# Patient Record
Sex: Male | Born: 1965 | Race: White | Hispanic: No | Marital: Married | Smoking: Never smoker
Health system: Southern US, Community
[De-identification: ages and names within clinical notes are randomized; demographics above are authoritative.]

## PROBLEM LIST (undated history)

## (undated) DIAGNOSIS — E785 Hyperlipidemia, unspecified: Secondary | ICD-10-CM

## (undated) DIAGNOSIS — I10 Essential (primary) hypertension: Secondary | ICD-10-CM

## (undated) HISTORY — PX: SPERMATOCELECTOMY: SHX2420

## (undated) HISTORY — DX: Hyperlipidemia, unspecified: E78.5

---

## 2012-02-01 ENCOUNTER — Encounter (HOSPITAL_COMMUNITY)
Admission: EM | Disposition: A | Discharge status: Home Health | Payer: Self-pay | Source: Home / Self Care | Attending: Emergency Medicine

## 2012-02-01 ENCOUNTER — Encounter (HOSPITAL_COMMUNITY): Payer: Self-pay | Admitting: Anesthesiology

## 2012-02-01 ENCOUNTER — Ambulatory Visit (HOSPITAL_COMMUNITY)
Admission: EM | Admit: 2012-02-01 | Discharge: 2012-02-02 | Disposition: A | Discharge status: Home Health | Attending: Orthopedic Surgery | Admitting: Orthopedic Surgery

## 2012-02-01 ENCOUNTER — Ambulatory Visit: Admit: 2012-02-01 | Payer: Self-pay | Admitting: Orthopedic Surgery

## 2012-02-01 ENCOUNTER — Encounter (HOSPITAL_COMMUNITY): Payer: Self-pay | Admitting: Emergency Medicine

## 2012-02-01 ENCOUNTER — Emergency Department (HOSPITAL_COMMUNITY): Admitting: Anesthesiology

## 2012-02-01 ENCOUNTER — Emergency Department (HOSPITAL_COMMUNITY)

## 2012-02-01 DIAGNOSIS — W230XXA Caught, crushed, jammed, or pinched between moving objects, initial encounter: Secondary | ICD-10-CM | POA: Insufficient documentation

## 2012-02-01 DIAGNOSIS — IMO0002 Reserved for concepts with insufficient information to code with codable children: Secondary | ICD-10-CM | POA: Insufficient documentation

## 2012-02-01 DIAGNOSIS — Z23 Encounter for immunization: Secondary | ICD-10-CM | POA: Insufficient documentation

## 2012-02-01 DIAGNOSIS — I10 Essential (primary) hypertension: Secondary | ICD-10-CM | POA: Insufficient documentation

## 2012-02-01 DIAGNOSIS — Y929 Unspecified place or not applicable: Secondary | ICD-10-CM | POA: Insufficient documentation

## 2012-02-01 DIAGNOSIS — S62609B Fracture of unspecified phalanx of unspecified finger, initial encounter for open fracture: Secondary | ICD-10-CM

## 2012-02-01 DIAGNOSIS — S61209A Unspecified open wound of unspecified finger without damage to nail, initial encounter: Secondary | ICD-10-CM | POA: Insufficient documentation

## 2012-02-01 HISTORY — DX: Essential (primary) hypertension: I10

## 2012-02-01 HISTORY — PX: ORIF FINGER FRACTURE: SHX2122

## 2012-02-01 LAB — CBC
HCT: 44 % (ref 39.0–52.0)
Hemoglobin: 15.4 g/dL (ref 13.0–17.0)
MCH: 30.3 pg (ref 26.0–34.0)
MCHC: 35 g/dL (ref 30.0–36.0)
MCV: 86.6 fL (ref 78.0–100.0)
Platelets: 277 10*3/uL (ref 150–400)
RBC: 5.08 MIL/uL (ref 4.22–5.81)
RDW: 13.4 % (ref 11.5–15.5)
WBC: 14.8 10*3/uL — ABNORMAL HIGH (ref 4.0–10.5)

## 2012-02-01 LAB — BASIC METABOLIC PANEL
BUN: 14 mg/dL (ref 6–23)
CO2: 30 mEq/L (ref 19–32)
Calcium: 9.6 mg/dL (ref 8.4–10.5)
Chloride: 100 mEq/L (ref 96–112)
Creatinine, Ser: 0.96 mg/dL (ref 0.50–1.35)
GFR calc Af Amer: >90 mL/min (ref >90)
GFR calc non Af Amer: >90 mL/min (ref >90)
Glucose, Bld: 120 mg/dL — ABNORMAL HIGH (ref 70–99)
Potassium: 3.9 mEq/L (ref 3.5–5.1)
Sodium: 137 mEq/L (ref 135–145)

## 2012-02-01 SURGERY — OPEN REDUCTION INTERNAL FIXATION (ORIF) METACARPAL (FINGER) FRACTURE
Anesthesia: General | Site: Finger | Laterality: Left | Wound class: Contaminated

## 2012-02-01 MED ORDER — EPHEDRINE SULFATE 50 MG/ML IJ SOLN
INTRAMUSCULAR | Status: DC | PRN
  Administered 2012-02-01: 10 mg via INTRAVENOUS

## 2012-02-01 MED ORDER — METHOCARBAMOL 500 MG PO TABS: 500.0000 mg | ORAL_TABLET | Freq: Four times a day (QID) | ORAL | Status: DC | PRN

## 2012-02-01 MED ORDER — HYDROCODONE-ACETAMINOPHEN 5-325 MG PO TABS
1.0000 | ORAL_TABLET | ORAL | Status: DC | PRN
  Administered 2012-02-01 – 2012-02-02 (×2): 2 via ORAL
  Filled 2012-02-01 (×2): qty 2

## 2012-02-01 MED ORDER — KCL IN DEXTROSE-NACL 20-5-0.45 MEQ/L-%-% IV SOLN
INTRAVENOUS | Status: DC
  Administered 2012-02-01: 23:00:00 via INTRAVENOUS
  Filled 2012-02-01 (×2): qty 1000

## 2012-02-01 MED ORDER — TETANUS-DIPHTH-ACELL PERTUSSIS 5-2.5-18.5 LF-MCG/0.5 IM SUSP
0.5000 mL | Freq: Once | INTRAMUSCULAR | Status: AC
  Administered 2012-02-01: 0.5 mL via INTRAMUSCULAR
  Filled 2012-02-01: qty 0.5

## 2012-02-01 MED ORDER — OXYCODONE HCL 5 MG PO TABS: 5.0000 mg | ORAL_TABLET | ORAL | Status: DC | PRN

## 2012-02-01 MED ORDER — ONDANSETRON HCL 4 MG PO TABS: 4.0000 mg | ORAL_TABLET | Freq: Four times a day (QID) | ORAL | Status: DC | PRN

## 2012-02-01 MED ORDER — ADULT MULTIVITAMIN W/MINERALS CH
1.0000 | ORAL_TABLET | Freq: Every day | ORAL | Status: DC
  Administered 2012-02-01 – 2012-02-02 (×2): 1 via ORAL
  Filled 2012-02-01 (×2): qty 1

## 2012-02-01 MED ORDER — FENTANYL CITRATE 0.05 MG/ML IJ SOLN
INTRAMUSCULAR | Status: DC | PRN
  Administered 2012-02-01 (×2): 100 ug via INTRAVENOUS
  Administered 2012-02-01 (×2): 50 ug via INTRAVENOUS

## 2012-02-01 MED ORDER — DROPERIDOL 2.5 MG/ML IJ SOLN
INTRAMUSCULAR | Status: DC | PRN
  Administered 2012-02-01: 0.625 mg via INTRAVENOUS

## 2012-02-01 MED ORDER — LACTATED RINGERS IV SOLN
INTRAVENOUS | Status: DC | PRN
  Administered 2012-02-01 (×2): via INTRAVENOUS

## 2012-02-01 MED ORDER — VITAMIN C 500 MG PO TABS
1000.0000 mg | ORAL_TABLET | Freq: Every day | ORAL | Status: DC
  Administered 2012-02-01 – 2012-02-02 (×2): 1000 mg via ORAL
  Filled 2012-02-01 (×2): qty 2

## 2012-02-01 MED ORDER — ONDANSETRON HCL 4 MG/2ML IJ SOLN
4.0000 mg | Freq: Once | INTRAMUSCULAR | Status: AC
  Administered 2012-02-01: 4 mg via INTRAVENOUS
  Filled 2012-02-01: qty 2

## 2012-02-01 MED ORDER — DOCUSATE SODIUM 100 MG PO CAPS
100.0000 mg | ORAL_CAPSULE | Freq: Two times a day (BID) | ORAL | Status: DC
  Administered 2012-02-01 – 2012-02-02 (×2): 100 mg via ORAL
  Filled 2012-02-01 (×3): qty 1

## 2012-02-01 MED ORDER — MORPHINE SULFATE 4 MG/ML IJ SOLN
6.0000 mg | Freq: Once | INTRAMUSCULAR | Status: AC
  Administered 2012-02-01: 4 mg via INTRAVENOUS
  Filled 2012-02-01: qty 1

## 2012-02-01 MED ORDER — DIPHENHYDRAMINE HCL 25 MG PO CAPS
25.0000 mg | ORAL_CAPSULE | Freq: Four times a day (QID) | ORAL | Status: DC | PRN
  Administered 2012-02-01: 25 mg via ORAL
  Filled 2012-02-01: qty 1

## 2012-02-01 MED ORDER — CEFAZOLIN SODIUM 1-5 GM-% IV SOLN
1.0000 g | Freq: Three times a day (TID) | INTRAVENOUS | Status: DC
  Administered 2012-02-02 (×2): 1 g via INTRAVENOUS
  Filled 2012-02-01 (×3): qty 50

## 2012-02-01 MED ORDER — MORPHINE SULFATE 4 MG/ML IJ SOLN
6.0000 mg | Freq: Once | INTRAMUSCULAR | Status: AC
  Administered 2012-02-01: 6 mg via INTRAVENOUS
  Filled 2012-02-01: qty 1

## 2012-02-01 MED ORDER — HYDROMORPHONE HCL PF 1 MG/ML IJ SOLN: 0.2500 mg | INTRAMUSCULAR | Status: DC | PRN

## 2012-02-01 MED ORDER — DROPERIDOL 2.5 MG/ML IJ SOLN: 0.6250 mg | INTRAMUSCULAR | Status: DC | PRN

## 2012-02-01 MED ORDER — CEFAZOLIN SODIUM 1-5 GM-% IV SOLN
1.0000 g | Freq: Once | INTRAVENOUS | Status: AC
  Administered 2012-02-01: 2 g via INTRAVENOUS
  Administered 2012-02-01: 1 g via INTRAVENOUS
  Filled 2012-02-01: qty 50

## 2012-02-01 MED ORDER — DEXAMETHASONE SODIUM PHOSPHATE 4 MG/ML IJ SOLN
INTRAMUSCULAR | Status: DC | PRN
  Administered 2012-02-01: 6 mg via INTRAVENOUS

## 2012-02-01 MED ORDER — ZOLPIDEM TARTRATE 5 MG PO TABS: 5.0000 mg | ORAL_TABLET | Freq: Every evening | ORAL | Status: DC | PRN

## 2012-02-01 MED ORDER — HYDROMORPHONE HCL PF 1 MG/ML IJ SOLN: 0.5000 mg | INTRAMUSCULAR | Status: DC | PRN

## 2012-02-01 MED ORDER — ALPRAZOLAM 0.5 MG PO TABS: 0.5000 mg | ORAL_TABLET | Freq: Four times a day (QID) | ORAL | Status: DC | PRN

## 2012-02-01 MED ORDER — LISINOPRIL 5 MG PO TABS
5.0000 mg | ORAL_TABLET | Freq: Every day | ORAL | Status: DC
  Administered 2012-02-02: 5 mg via ORAL
  Filled 2012-02-01: qty 1

## 2012-02-01 MED ORDER — SIMVASTATIN 20 MG PO TABS
20.0000 mg | ORAL_TABLET | Freq: Every evening | ORAL | Status: DC
  Filled 2012-02-01: qty 1

## 2012-02-01 MED ORDER — DEXTROSE 5 % IV SOLN
INTRAVENOUS | Status: DC | PRN
  Administered 2012-02-01: 18:00:00 via INTRAVENOUS

## 2012-02-01 MED ORDER — ONDANSETRON HCL 4 MG/2ML IJ SOLN: 4.0000 mg | Freq: Four times a day (QID) | INTRAMUSCULAR | Status: DC | PRN

## 2012-02-01 MED ORDER — METHOCARBAMOL 100 MG/ML IJ SOLN
500.0000 mg | Freq: Four times a day (QID) | INTRAVENOUS | Status: DC | PRN
  Filled 2012-02-01: qty 5

## 2012-02-01 MED ORDER — MIDAZOLAM HCL 5 MG/5ML IJ SOLN
INTRAMUSCULAR | Status: DC | PRN
  Administered 2012-02-01: 2 mg via INTRAVENOUS

## 2012-02-01 MED ORDER — BUPIVACAINE HCL (PF) 0.25 % IJ SOLN
INTRAMUSCULAR | Status: DC | PRN
  Administered 2012-02-01: 7 mL

## 2012-02-01 MED ORDER — PROPOFOL 10 MG/ML IV EMUL
INTRAVENOUS | Status: DC | PRN
  Administered 2012-02-01: 200 mg via INTRAVENOUS

## 2012-02-01 MED ORDER — ONDANSETRON HCL 4 MG/2ML IJ SOLN
INTRAMUSCULAR | Status: DC | PRN
  Administered 2012-02-01: 4 mg via INTRAVENOUS

## 2012-02-01 SURGICAL SUPPLY — 57 items
BANDAGE CONFORM 2X5YD N/S (GAUZE/BANDAGES/DRESSINGS) ×2 IMPLANT
BANDAGE ELASTIC 3 VELCRO ST LF (GAUZE/BANDAGES/DRESSINGS) ×2 IMPLANT
BANDAGE ELASTIC 4 VELCRO ST LF (GAUZE/BANDAGES/DRESSINGS) IMPLANT
BANDAGE GAUZE ELAST BULKY 4 IN (GAUZE/BANDAGES/DRESSINGS) IMPLANT
BIT DRILL 1.1 MINI QC NONSTRL (BIT) ×2 IMPLANT
BNDG COHESIVE 1X5 TAN STRL LF (GAUZE/BANDAGES/DRESSINGS) ×2 IMPLANT
BNDG ELASTIC 2 VLCR STRL LF (GAUZE/BANDAGES/DRESSINGS) IMPLANT
BNDG ESMARK 4X9 LF (GAUZE/BANDAGES/DRESSINGS) ×2 IMPLANT
CAP PIN ORTHO PINK (CAP) IMPLANT
CAP PIN PROTECTOR ORTHO WHT (CAP) IMPLANT
CLOTH BEACON ORANGE TIMEOUT ST (SAFETY) ×2 IMPLANT
CORDS BIPOLAR (ELECTRODE) ×2 IMPLANT
COVER SURGICAL LIGHT HANDLE (MISCELLANEOUS) ×2 IMPLANT
CUFF TOURNIQUET SINGLE 18IN (TOURNIQUET CUFF) ×2 IMPLANT
CUFF TOURNIQUET SINGLE 24IN (TOURNIQUET CUFF) IMPLANT
DRAPE OEC MINIVIEW 54X84 (DRAPES) IMPLANT
DRAPE SURG 17X23 STRL (DRAPES) ×2 IMPLANT
DRSG ADAPTIC 3X8 NADH LF (GAUZE/BANDAGES/DRESSINGS) IMPLANT
GAUZE SPONGE 2X2 8PLY STRL LF (GAUZE/BANDAGES/DRESSINGS) ×1 IMPLANT
GAUZE XEROFORM 1X8 LF (GAUZE/BANDAGES/DRESSINGS) ×2 IMPLANT
GLOVE BIOGEL PI IND STRL 8.5 (GLOVE) ×1 IMPLANT
GLOVE BIOGEL PI INDICATOR 8.5 (GLOVE) ×1
GLOVE SURG ORTHO 8.0 STRL STRW (GLOVE) ×2 IMPLANT
GOWN PREVENTION PLUS XLARGE (GOWN DISPOSABLE) ×2 IMPLANT
GOWN STRL NON-REIN LRG LVL3 (GOWN DISPOSABLE) ×4 IMPLANT
K-WIRE SMTH SNGL TROCAR .028X4 (WIRE)
KIT BASIN OR (CUSTOM PROCEDURE TRAY) ×2 IMPLANT
KIT ROOM TURNOVER OR (KITS) ×2 IMPLANT
KWIRE SMTH SNGL TROCAR .028X4 (WIRE) IMPLANT
MANIFOLD NEPTUNE II (INSTRUMENTS) ×2 IMPLANT
NEEDLE HYPO 25GX1X1/2 BEV (NEEDLE) IMPLANT
NS IRRIG 1000ML POUR BTL (IV SOLUTION) ×2 IMPLANT
PACK ORTHO EXTREMITY (CUSTOM PROCEDURE TRAY) ×2 IMPLANT
PAD ARMBOARD 7.5X6 YLW CONV (MISCELLANEOUS) ×4 IMPLANT
PAD CAST 3X4 CTTN HI CHSV (CAST SUPPLIES) ×1 IMPLANT
PAD CAST 4YDX4 CTTN HI CHSV (CAST SUPPLIES) IMPLANT
PADDING CAST COTTON 2X4 NS (CAST SUPPLIES) ×2 IMPLANT
PADDING CAST COTTON 3X4 STRL (CAST SUPPLIES) ×1
PADDING CAST COTTON 4X4 STRL (CAST SUPPLIES)
PADDING UNDERCAST 2  STERILE (CAST SUPPLIES) IMPLANT
PLATE T CONT 1.5MM LOCKING (Plate) ×2 IMPLANT
SCREW LOCKING 1.5X10 (Screw) ×2 IMPLANT
SCREW NL 1.5X12 (Screw) ×2 IMPLANT
SCREW NONIOC 1.5 10M (Screw) ×8 IMPLANT
SOAP 2 % CHG 4 OZ (WOUND CARE) ×2 IMPLANT
SPLINT FIBERGLASS 3X35 (CAST SUPPLIES) ×2 IMPLANT
SPONGE GAUZE 2X2 STER 10/PKG (GAUZE/BANDAGES/DRESSINGS) ×1
SPONGE GAUZE 4X4 12PLY (GAUZE/BANDAGES/DRESSINGS) IMPLANT
SUCTION FRAZIER TIP 10 FR DISP (SUCTIONS) ×2 IMPLANT
SUT MERSILENE 4 0 P 3 (SUTURE) IMPLANT
SUT PROLENE 4 0 PS 2 18 (SUTURE) ×6 IMPLANT
SYR CONTROL 10ML LL (SYRINGE) IMPLANT
TOWEL OR 17X24 6PK STRL BLUE (TOWEL DISPOSABLE) ×2 IMPLANT
TOWEL OR 17X26 10 PK STRL BLUE (TOWEL DISPOSABLE) ×2 IMPLANT
TUBE CONNECTING 12X1/4 (SUCTIONS) ×2 IMPLANT
UNDERPAD 30X30 INCONTINENT (UNDERPADS AND DIAPERS) ×2 IMPLANT
WATER STERILE IRR 1000ML POUR (IV SOLUTION) ×2 IMPLANT

## 2012-02-01 NOTE — Transfer of Care (Signed)
Immediate Anesthesia Transfer of Care Note  Patient: Keith Norris  Procedure(s) Performed: Procedure(s) (LRB): OPEN REDUCTION INTERNAL FIXATION (ORIF) METACARPAL (FINGER) FRACTURE (Left)  Patient Location: PACU  Anesthesia Type: General  Level of Consciousness: awake, alert , oriented, patient cooperative and responds to stimulation  Airway & Oxygen Therapy: Patient Spontanous Breathing and Patient connected to nasal cannula oxygen  Post-op Assessment: Report given to PACU RN, Post -op Vital signs reviewed and unstable, Anesthesiologist notified, Patient moving all extremities and Patient moving all extremities X 4  Post vital signs: Reviewed and stable  Complications: No apparent anesthesia complications

## 2012-02-01 NOTE — ED Notes (Signed)
Bleeding controlled by pressure. Deep  1 cm laceration

## 2012-02-01 NOTE — Preoperative (Signed)
Beta Blockers   Reason not to administer Beta Blockers:Not Applicable 

## 2012-02-01 NOTE — Anesthesia Procedure Notes (Signed)
Procedure Name: LMA Insertion Date/Time: 02/01/2012 6:06 PM Performed by: Wray Kearns A Pre-anesthesia Checklist: Patient identified, Timeout performed, Emergency Drugs available, Suction available and Patient being monitored Patient Re-evaluated:Patient Re-evaluated prior to inductionOxygen Delivery Method: Circle system utilized Preoxygenation: Pre-oxygenation with 100% oxygen Intubation Type: IV induction Ventilation: Mask ventilation without difficulty LMA Size: 4.0 Tube type: Oral Number of attempts: 1 Placement Confirmation: breath sounds checked- equal and bilateral,  positive ETCO2 and CO2 detector Secured at: 28 cm Tube secured with: Tape Dental Injury: Teeth and Oropharynx as per pre-operative assessment

## 2012-02-01 NOTE — Discharge Instructions (Signed)
KEEP BANDAGE CLEAN AND DRY °CALL OFFICE FOR F/U APPT [phone] IN 10 DAYS °KEEP HAND ELEVATED ABOVE HEART °OK TO APPLY ICE TO OPERATIVE AREA °CONTACT OFFICE IF ANY WORSENING PAIN OR CONCERNS. °

## 2012-02-01 NOTE — H&P (Signed)
Keith Norris is an 46 y.o. male.   Chief Complaint: CRUSH INJURY TO LEFT SMALL FINGER HPI: PT WITH OPEN FRACTURE TO LEFT SMALL FINGER PT C/O PAIN TO SMALL FINGER RECEIVED IV ABX IN ED NO OTHER COMPLAINTS  Past Medical History  Diagnosis Date  . Hypertension     Past Surgical History  Procedure Date  . Spermatocelectomy     History reviewed. No pertinent family history. Social History:  reports that he has been smoking.  His smokeless tobacco use includes Chew. He reports that he does not drink alcohol. His drug history not on file.  Allergies: No Known Allergies  Medications Prior to Admission  Medication Dose Route Frequency Provider Last Rate Last Dose  . ceFAZolin (ANCEF) IVPB 1 g/50 mL premix  1 g Intravenous Once Jamesetta Orleans Lawyer, PA-C   1 g at 02/01/12 1301  . morphine 4 MG/ML injection 6 mg  6 mg Intravenous Once Jamesetta Orleans Lawyer, PA-C   6 mg at 02/01/12 1301  . morphine 4 MG/ML injection 6 mg  6 mg Intravenous Once Jamesetta Orleans Lawyer, PA-C   4 mg at 02/01/12 1548  . ondansetron (ZOFRAN) injection 4 mg  4 mg Intravenous Once Jamesetta Orleans Lawyer, PA-C   4 mg at 02/01/12 1301  . TDaP (BOOSTRIX) injection 0.5 mL  0.5 mL Intramuscular Once Jamesetta Orleans Lawyer, PA-C   0.5 mL at 02/01/12 1457   No current outpatient prescriptions on file as of 02/01/2012.    Results for orders placed during the hospital encounter of 02/01/12 (from the past 48 hour(s))  CBC     Status: Abnormal   Collection Time   02/01/12  1:05 PM      Component Value Range Comment   WBC 14.8 (*) 4.0 - 10.5 (K/uL)    RBC 5.08  4.22 - 5.81 (MIL/uL)    Hemoglobin 15.4  13.0 - 17.0 (g/dL)    HCT 96.0  45.4 - 09.8 (%)    MCV 86.6  78.0 - 100.0 (fL)    MCH 30.3  26.0 - 34.0 (pg)    MCHC 35.0  30.0 - 36.0 (g/dL)    RDW 11.9  14.7 - 82.9 (%)    Platelets 277  150 - 400 (K/uL)   BASIC METABOLIC PANEL     Status: Abnormal   Collection Time   02/01/12  1:05 PM      Component Value Range Comment   Sodium 137  135 - 145 (mEq/L)    Potassium 3.9  3.5 - 5.1 (mEq/L)    Chloride 100  96 - 112 (mEq/L)    CO2 30  19 - 32 (mEq/L)    Glucose, Bld 120 (*) 70 - 99 (mg/dL)    BUN 14  6 - 23 (mg/dL)    Creatinine, Ser 5.62  0.50 - 1.35 (mg/dL)    Calcium 9.6  8.4 - 10.5 (mg/dL)    GFR calc non Af Amer >90  >90 (mL/min)    GFR calc Af Amer >90  >90 (mL/min)    Dg Finger Little Left  02/01/2012  *RADIOLOGY REPORT*  Clinical Data: Crush injury.  Hand caught in trailer age.  LEFT LITTLE FINGER 2+V  Comparison: No priors.  Findings: There is an acute displaced and angulated fracture of the distal third of the proximal phalanx of the left fifth finger. Specifically, the distal fracture fragment appears rotated nearly 90 degrees, and there is some volar migration of the fracture fragment such that the fracture  surfaces are no longer apposed. Overlying soft tissues appear irregular, suggesting an open fracture.  The middle phalanx and distal phalanx of the fifth digit otherwise appear intact.  Remaining portions of the hand are otherwise unremarkable.  IMPRESSION: 1.  Acute displaced and angulated (likely open) fracture of the proximal phalanx of the left fifth finger, as above.  Original Report Authenticated By: Florencia Reasons, M.D.    NO RECENT ILLNESSES OR HOSPITALIZATIONS  Blood pressure 127/65, pulse 72, temperature 98.1 F (36.7 C), temperature source Oral, resp. rate 20, SpO2 96.00%. General Appearance:  Alert, cooperative, no distress, appears stated age  Head:  Normocephalic, without obvious abnormality, atraumatic  Eyes:  Pupils equal, conjunctiva/corneas clear,         Throat: Lips, mucosa, and tongue normal; teeth and gums normal  Neck: No visible masses     Lungs:   respirations unlabored  Chest Wall:  No tenderness or deformity  Heart:  Regular rate and rhythm,  Abdomen:   Soft, non-tender,         Extremities: LEFT SMALL FINGER OPEN WOUND DORSALLY AND VOLARLY OBVIOUS DEFORMITY  TO SMALL FINGER FINGER TIP WARM WELL PERFUSED NO WOUNDS TO RING/INDEX AND LONG GOOD WRIST MOBILITY AND FOREARM ROTATION  Pulses: 2+ and symmetric  Skin: Skin color, texture, turgor normal, no rashes or lesions     Neurologic: Normal    Assessment/Plan LEFT SMALL FINGER OPEN PROXIMAL PHALANX FRACTURE  IRRIGATION AND DEBRIDEMENT AND OPEN REPAIR OF FRACTURE  RISKS INCLUDE NOT LIMITED TO BLEEDING,INFECTION, NERVE,ARTERY, TENDON DAMAGE, NONUNION,MALUNION, HARDWARE FAILURE, LOSS OF MOTION OF FINGERS,WRIST,FOREARM,ELBOW AND NEED FOR FURTHER SURGERY.  R/B/A DISCUSSED WITH PT IN OFFICE.  PT VOICED UNDERSTANDING OF PLAN CONSENT SIGNED DAY OF SURGERY PT SEEN AND EXAMINED PRIOR TO OPERATIVE PROCEDURE/DAY OF SURGERY SITE MARKED. QUESTIONS ANSWERED WILL REMAIN INPATIENT FOLLOWING SURGERY   Sharma Covert 02/01/2012, 5:39 PM

## 2012-02-01 NOTE — ED Notes (Signed)
Called [facility] for transfer 

## 2012-02-01 NOTE — Anesthesia Preprocedure Evaluation (Addendum)
Anesthesia Evaluation  Patient identified by MRN, date of birth, ID band Patient awake    Reviewed: Allergy & Precautions, H&P , NPO status , Patient's Chart, lab work & pertinent test results  Airway Mallampati: II TM Distance: >3 FB Neck ROM: Full    Dental   Pulmonary neg pulmonary ROS,    Pulmonary exam normal       Cardiovascular hypertension, Pt. on medications     Neuro/Psych negative neurological ROS  negative psych ROS   GI/Hepatic negative GI ROS, Neg liver ROS,   Endo/Other  negative endocrine ROS  Renal/GU negative Renal ROS     Musculoskeletal   Abdominal   Peds  Hematology negative hematology ROS (+)   Anesthesia Other Findings   Reproductive/Obstetrics                          Anesthesia Physical Anesthesia Plan  ASA: II and Emergent  Anesthesia Plan: General   Post-op Pain Management:    Induction: Intravenous  Airway Management Planned: LMA  Additional Equipment:   Intra-op Plan:   Post-operative Plan: Extubation in OR  Informed Consent: I have reviewed the patients History and Physical, chart, labs and discussed the procedure including the risks, benefits and alternatives for the proposed anesthesia with the patient or authorized representative who has indicated his/her understanding and acceptance.   Dental advisory given  Plan Discussed with: CRNA, Anesthesiologist and Surgeon  Anesthesia Plan Comments:         Anesthesia Quick Evaluation

## 2012-02-01 NOTE — Brief Op Note (Signed)
02/01/2012  7:46 PM  PATIENT:  Keith Norris  46 y.o. male  PRE-OPERATIVE DIAGNOSIS:  crushed finger  POST-OPERATIVE DIAGNOSIS:  crush injury left small finger   PROCEDURE:  Procedure(s) (LRB): OPEN REDUCTION INTERNAL FIXATION (ORIF) METACARPAL (FINGER) FRACTURE (Left)  SURGEON:  Surgeon(s) and Role:    * Sharma Covert, MD - Primary  PHYSICIAN ASSISTANT:   ASSISTANTS: none   ANESTHESIA:   general  EBL:  Total I/O In: 700 [I.V.:700] Out: -   BLOOD ADMINISTERED:none  DRAINS: none   LOCAL MEDICATIONS USED:  MARCAINE     SPECIMEN:  No Specimen  DISPOSITION OF SPECIMEN:  N/A  COUNTS:  YES  TOURNIQUET:   Total Tourniquet Time Documented: Upper Arm (Left) - 53 minutes  DICTATION: .Other Dictation: Dictation Number [id number]  PLAN OF CARE: Admit for overnight observation  PATIENT DISPOSITION:  PACU - hemodynamically stable.   Delay start of Pharmacological VTE agent (>24hrs) due to surgical blood loss or risk of bleeding: no

## 2012-02-01 NOTE — Anesthesia Postprocedure Evaluation (Signed)
Anesthesia Post Note  Patient: Keith Norris  Procedure(s) Performed: Procedure(s) (LRB): OPEN REDUCTION INTERNAL FIXATION (ORIF) METACARPAL (FINGER) FRACTURE (Left)  Anesthesia type: general  Patient location: PACU  Post pain: Pain level controlled  Post assessment: Patient's Cardiovascular Status Stable  Last Vitals:  Filed Vitals:   02/01/12 2010  BP:   Pulse:   Temp: 36.5 C  Resp:     Post vital signs: Reviewed and stable  Level of consciousness: sedated  Complications: No apparent anesthesia complications

## 2012-02-01 NOTE — ED Provider Notes (Signed)
History     CSN: [id number]  Arrival date & time 02/01/12  1056   First MD Initiated Contact with Patient 02/01/12 1123      Chief Complaint  Patient presents with  . Laceration    Left 5 th finger lacerated and pinned between wood cutter and tractor hitch    (Consider location/radiation/quality/duration/timing/severity/associated sxs/prior treatment) HPI Patient states he was unloading a wood chipper of his trailer hitch when he dropped the time of the chipper onto his finger smashing between the hitch itself.  He states that he noticed other lacerations or active and brought out to the emergency room for further evaluation.  Patient denies numbness of the finger.  He states he is having a hard time moving it and it is difficult to move.  He states that he is unclear of his tetanus shot. Past Medical History  Diagnosis Date  . Hypertension     Past Surgical History  Procedure Date  . Spermatocelectomy     History reviewed. No pertinent family history.  History  Substance Use Topics  . Smoking status: Current Everyday Smoker  . Smokeless tobacco: Current User    Types: Chew  . Alcohol Use: No      Review of Systems All pertinent positives and negatives reviewed in the history of present illness  Allergies  Review of patient's allergies indicates no known allergies.  Home Medications   Current Outpatient Rx  Name Route Sig Dispense Refill  . LISINOPRIL 5 MG PO TABS Oral Take 5 mg by mouth daily.    Marland Kitchen SIMVASTATIN 20 MG PO TABS Oral Take 20 mg by mouth every evening.      BP 123/77  Pulse 92  Temp(Src) 98 F (36.7 C) (Oral)  Resp 18  SpO2 95%  Physical Exam  Constitutional: He is oriented to person, place, and time. He appears well-developed and well-nourished.  HENT:  Head: Normocephalic and atraumatic.  Cardiovascular: Normal rate, regular rhythm and normal heart sounds.   Pulmonary/Chest: Effort normal and breath sounds normal.  Musculoskeletal:    Hands: Neurological: He is alert and oriented to person, place, and time.  Skin: Skin is warm and dry.    ED Course  Procedures (including critical care time)  Labs Reviewed  CBC - Abnormal; Notable for the following:    WBC 14.8 (*)    All other components within normal limits  BASIC METABOLIC PANEL - Abnormal; Notable for the following:    Glucose, Bld 120 (*)    All other components within normal limits   Dg Finger Little Left  02/01/2012  *RADIOLOGY REPORT*  Clinical Data: Crush injury.  Hand caught in trailer age.  LEFT LITTLE FINGER 2+V  Comparison: No priors.  Findings: There is an acute displaced and angulated fracture of the distal third of the proximal phalanx of the left fifth finger. Specifically, the distal fracture fragment appears rotated nearly 90 degrees, and there is some volar migration of the fracture fragment such that the fracture surfaces are no longer apposed. Overlying soft tissues appear irregular, suggesting an open fracture.  The middle phalanx and distal phalanx of the fifth digit otherwise appear intact.  Remaining portions of the hand are otherwise unremarkable.  IMPRESSION: 1.  Acute displaced and angulated (likely open) fracture of the proximal phalanx of the left fifth finger, as above.  Original Report Authenticated By: Florencia Reasons, M.D.     I spoke with Dr. Melvyn Novas about the patient and he would like for  him to be sent to [facility].  I spoke with the CDU PA as well as the blue side Dr. about having the patient sent to the CDU.  Patient is given Ancef 1 g.  The wound is clean and then wrapped.   MDM  MDM Reviewed: nursing note and vitals Interpretation: x-ray and labs Consults: orthopedics            Carlyle Dolly, PA-C 02/01/12 1447

## 2012-02-02 MED ORDER — OXYCODONE-ACETAMINOPHEN 5-325 MG PO TABS: 1.0000 | ORAL_TABLET | ORAL | Status: AC | PRN

## 2012-02-02 MED ORDER — DOCUSATE SODIUM 100 MG PO CAPS: 100.0000 mg | ORAL_CAPSULE | Freq: Two times a day (BID) | ORAL | Status: AC

## 2012-02-02 MED ORDER — CEPHALEXIN 500 MG PO CAPS: 500.0000 mg | ORAL_CAPSULE | Freq: Four times a day (QID) | ORAL | Status: AC

## 2012-02-02 MED ORDER — VITAMIN C 500 MG PO TABS: 500.0000 mg | ORAL_TABLET | Freq: Two times a day (BID) | ORAL | Status: AC

## 2012-02-02 NOTE — Op Note (Signed)
NAME:  BASEL, DEFALCO NO.:  [id number]  MEDICAL RECORD NO.:  [id number]  LOCATION:  5019                         FACILITY:  [facility]  PHYSICIAN:  Madelynn Done, MD  DATE OF BIRTH:  01-21-1966  DATE OF PROCEDURE:  02/01/2012 DATE OF DISCHARGE:                              OPERATIVE REPORT   PREOPERATIVE DIAGNOSIS:  Grade 2 left open small finger proximal phalanx fracture.  POSTOPERATIVE DIAGNOSIS:  Grade 2 left open small finger proximal phalanx fracture.  ATTENDING PHYSICIAN:  Madelynn Done, MD who was scrubbed and present for the entire procedure.  ASSISTANT SURGEON:  None.  ANESTHESIA:  General via LMA.  SURGICAL TREATMENT: 1. Debridement of skin, subcutaneous tissue, and bone associated with     open fracture, left small finger. 2. Left small finger open treatment of phalangeal shaft fracture     requiring internal fixation. 3. Repair left small finger extensor tendon. 4. Radiographs 2 views left small finger.  SURGICAL IMPLANTS:  DePuy 1.5 mm 6-hole plate, 2 screws distally, 3 screws proximally.  SURGICAL INDICATIONS:  Mr. Reha is a 46 year old gentleman who sustained a crushing injury to the left small finger.  The patient was seen and evaluated and given the open injury is recommended that he undergo the above procedure.  Risks, benefits, and alternatives were discussed in detail with the patient and signed informed consent was obtained.  Risks include, but not limited to bleeding, infection, damage to nearby nerves, arteries, or tendons, loss of motion of wrist and digits, nonunion, malunion, hardware failure, and need for further surgical intervention.  DESCRIPTION OF PROCEDURE:  The patient was properly identified in the preop holding area and a mark with a permanent marker made on left small finger to indicate the correct operative site.  The patient was then brought back to the operating room, placed supine on the anesthesia  room table where general anesthesia was administered.  The patient received preoperative antibiotics.  Well-padded tourniquet was then placed in the left brachium and sealed with 1000 drape.  The left upper extremity was then prepped and draped in normal sterile fashion.  Time-out was called, the correct side was identified, and procedure then begun.  The patient did have 2 wounds, one volar and one dorsal.  The dorsal wound was then extended proximally after the tourniquet inflated.  Dissection was then carried down through the skin and subcutaneous tissue.  The extensor tendon had a central tear in it.  The tendon tear was then lengthened both proximally and distally.  Following this, direct exposure of the fracture site was then carried out.  Excisional debridement of skin and subcutaneous tissue in the open fracture site was then carried out removing small loose fragments of bone.  After excisional debridement had been carried out sharply with knife, curettes, and rongeurs, a thorough wound irrigation was done.  After open treatment of the fracture site, open reduction was then carried out and was reduced nicely.  A 6-hole plate was then applied at the dorsal aspect of the finger and then held with the 1.5-mm screws both proximally and distally.  Appropriate 1 mm drill bit was used in depth gauge measurement, and  a total of 3 locking proximally and 1 nonlocking screw placed distally to avoid penetration into the articular margin.  The wound was then thoroughly irrigated.  The extensor tendon was then repaired using 4-0 Ethibond suture and figure-of-eight horizontal mattress suture.  Copious wound irrigation done.  The tourniquet deflated with good perfusion of the fingertip.  The skin was then closed using simple Prolene sutures.  The volar wound was loosely tacked back with #2 Prolene sutures.  A 10 mL of 0.25% Marcaine was infiltrated locally.  Xeroform dressing and sterile  compressive bandage then applied.  The patient was then placed in a well-padded ulnar gutter splint, extubated and taken to recovery room in good condition.  POSTOPERATIVE PLAN:  The patient will be admitted overnight for IV antibiotics, pain control, discharged in the morning, seen back in the office in approximately 10 days, x-rays out of the splint, then begin a therapy regimen to begin active range of motion of the finger.  Seen back at the attending mark 3 week mark, 5 week mark, and 9 week mark. Radiographs at each visit.     Madelynn Done, MD     FWO/MEDQ  D:  02/01/2012  T:  02/02/2012  Job:  [id number]

## 2012-02-02 NOTE — Discharge Summary (Signed)
Physician Discharge Summary  Patient ID: Keith Norris MRN: [id number] DOB/AGE: 1966-02-11 46 y.o.  Admit date: 02/01/2012 Discharge date: 02/02/2012  Admission Diagnoses: crushed finger Past Medical History  Diagnosis Date  . Hypertension     Discharge Diagnoses:  Active Problems:  * No active hospital problems. *    Surgeries: Procedure(s): OPEN REDUCTION INTERNAL FIXATION (ORIF) METACARPAL (FINGER) FRACTURE on 02/01/2012    Consultants:  none  Discharged Condition: Improved  Hospital Course: Keith Norris is an 46 y.o. male who was admitted 02/01/2012 with a chief complaint of  Chief Complaint  Patient presents with  . Laceration    Left 5 th finger lacerated and pinned between wood cutter and tractor hitch  , and found to have a diagnosis of crushed finger.  They were brought to the operating room on 02/01/2012 and underwent Procedure(s): OPEN REDUCTION INTERNAL FIXATION (ORIF) METACARPAL (FINGER) FRACTURE.    They were given perioperative antibiotics: Anti-infectives     Start     Dose/Rate Route Frequency Ordered Stop   02/02/12 0200   ceFAZolin (ANCEF) IVPB 1 g/50 mL premix        1 g 100 mL/hr over 30 Minutes Intravenous Every 8 hours 02/01/12 2056     02/02/12 0000   cephALEXin (KEFLEX) 500 MG capsule        500 mg Oral 4 times daily 02/02/12 1208 02/12/12 2359   02/01/12 1245   ceFAZolin (ANCEF) IVPB 1 g/50 mL premix        1 g 100 mL/hr over 30 Minutes Intravenous  Once 02/01/12 1236 02/01/12 1800        .  They were given sequential compression devices, early ambulation, and  for DVT prophylaxis.  Recent vital signs: Patient Vitals for the past 24 hrs:  BP Temp Temp src Pulse Resp SpO2 Height  02/01/12 2037 130/74 mmHg 98.2 F (36.8 C) - 95  20  96 % 5\' 11"  (1.803 m)  02/01/12 2010 - 97.7 F (36.5 C) - - - - -  02/01/12 2009 130/61 mmHg - - 88  16  95 % -  02/01/12 2007 130/61 mmHg - - 91  15  95 % -  02/01/12 2006 - - - 96  18  96 % -  02/01/12  2000 129/65 mmHg - - 96  15  94 % -  02/01/12 1945 - 97.7 F (36.5 C) - - - - -  02/01/12 1456 127/65 mmHg 98.1 F (36.7 C) Oral 72  20  96 % -  .  Recent laboratory studies: Dg Finger Little Left  02/01/2012  *RADIOLOGY REPORT*  Clinical Data: Crush injury.  Hand caught in trailer age.  LEFT LITTLE FINGER 2+V  Comparison: No priors.  Findings: There is an acute displaced and angulated fracture of the distal third of the proximal phalanx of the left fifth finger. Specifically, the distal fracture fragment appears rotated nearly 90 degrees, and there is some volar migration of the fracture fragment such that the fracture surfaces are no longer apposed. Overlying soft tissues appear irregular, suggesting an open fracture.  The middle phalanx and distal phalanx of the fifth digit otherwise appear intact.  Remaining portions of the hand are otherwise unremarkable.  IMPRESSION: 1.  Acute displaced and angulated (likely open) fracture of the proximal phalanx of the left fifth finger, as above.  Original Report Authenticated By: Florencia Reasons, M.D.    Discharge Medications:   Medication List  As of 02/02/2012 12:13 PM  TAKE these medications         cephALEXin 500 MG capsule   Commonly known as: KEFLEX   Take 1 capsule (500 mg total) by mouth 4 (four) times daily.      docusate sodium 100 MG capsule   Commonly known as: COLACE   Take 1 capsule (100 mg total) by mouth 2 (two) times daily.      lisinopril 5 MG tablet   Commonly known as: PRINIVIL,ZESTRIL   Take 5 mg by mouth daily.      oxyCODONE-acetaminophen 5-325 MG per tablet   Commonly known as: PERCOCET   Take 1 tablet by mouth every 4 (four) hours as needed for pain.      simvastatin 20 MG tablet   Commonly known as: ZOCOR   Take 20 mg by mouth every evening.      vitamin C 500 MG tablet   Commonly known as: ASCORBIC ACID   Take 1 tablet (500 mg total) by mouth 2 (two) times daily.            Diagnostic Studies: Dg  Finger Little Left  28-Feb-2012  *RADIOLOGY REPORT*  Clinical Data: Crush injury.  Hand caught in trailer age.  LEFT LITTLE FINGER 2+V  Comparison: No priors.  Findings: There is an acute displaced and angulated fracture of the distal third of the proximal phalanx of the left fifth finger. Specifically, the distal fracture fragment appears rotated nearly 90 degrees, and there is some volar migration of the fracture fragment such that the fracture surfaces are no longer apposed. Overlying soft tissues appear irregular, suggesting an open fracture.  The middle phalanx and distal phalanx of the fifth digit otherwise appear intact.  Remaining portions of the hand are otherwise unremarkable.  IMPRESSION: 1.  Acute displaced and angulated (likely open) fracture of the proximal phalanx of the left fifth finger, as above.  Original Report Authenticated By: Florencia Reasons, M.D.    They benefited maximally from their hospital stay and there were no complications.     Disposition: Final discharge disposition not confirmed Discharge Orders    Future Orders Please Complete By Expires   Discharge patient        Follow-up Information    Follow up with Sharma Covert, MD.   Contact information:   [facility] [address] [place] [address] [phone]           Signed: Sharma Covert 02/02/2012, 12:13 PM

## 2012-02-02 NOTE — Discharge Planning (Signed)
Patient discharged home in stable condition. Verbalizes understanding of all discharge instructions, including home medications and follow up appointments. 

## 2012-02-03 ENCOUNTER — Encounter (HOSPITAL_COMMUNITY): Payer: Self-pay | Admitting: Orthopedic Surgery

## 2012-02-03 NOTE — ED Provider Notes (Signed)
Medical screening examination/treatment/procedure(s) were performed by non-physician practitioner and as supervising physician I was immediately available for consultation/collaboration.   Laray Anger, DO 02/03/12 1057

## 2014-08-12 ENCOUNTER — Telehealth: Payer: Self-pay | Admitting: Pulmonary Disease

## 2014-08-12 ENCOUNTER — Ambulatory Visit (INDEPENDENT_AMBULATORY_CARE_PROVIDER_SITE_OTHER): Admitting: Pulmonary Disease

## 2014-08-12 ENCOUNTER — Encounter: Payer: Self-pay | Admitting: Pulmonary Disease

## 2014-08-12 VITALS — BP 120/70 | HR 102 | Temp 98.3°F | Ht 71.0 in | Wt 238.6 lb

## 2014-08-12 DIAGNOSIS — G4733 Obstructive sleep apnea (adult) (pediatric): Secondary | ICD-10-CM

## 2014-08-12 NOTE — Assessment & Plan Note (Signed)
He has snoring, sleep disruption, witnessed apnea, and daytime sleepiness.  These have been getting worse.  His symptoms started in 1999 when he was in active [employer] duty with the [employer].  He has sleep study in 2012 which showed mild sleep apnea.  I have reviewed the recent sleep study results with the patient.  We discussed how sleep apnea can affect various health problems including risks for hypertension, cardiovascular disease, and diabetes.  We also discussed how sleep disruption can increase risks for accident, such as while driving.  Weight loss as a means of improving sleep apnea was also reviewed.  Additional treatment options discussed were CPAP therapy, oral appliance, and surgical intervention.  [facility]  Will schedule for follow up here in 6 months to re-assess status of sleep apnea therapy.

## 2014-08-12 NOTE — Patient Instructions (Signed)
Follow up in 6 months 

## 2014-08-12 NOTE — Progress Notes (Deleted)
   Subjective:    Patient ID: Keith Norris, male    DOB: 1966/11/06, 48 y.o.   MRN: [id number]  HPI    Review of Systems  Constitutional: Negative for fever and unexpected weight change.  HENT: Negative for congestion, dental problem, ear pain, nosebleeds, postnasal drip, rhinorrhea, sinus pressure, sneezing, sore throat and trouble swallowing.   Eyes: Negative for redness and itching.  Respiratory: Negative for cough, chest tightness, shortness of breath and wheezing.   Cardiovascular: Negative for palpitations and leg swelling.  Gastrointestinal: Negative for nausea and vomiting.  Genitourinary: Negative for dysuria.  Musculoskeletal: Negative for joint swelling.  Skin: Negative for rash.  Neurological: Negative for headaches.  Hematological: Does not bruise/bleed easily.  Psychiatric/Behavioral: Negative for dysphoric mood. The patient is not nervous/anxious.        Objective:   Physical Exam        Assessment & Plan:

## 2014-08-12 NOTE — Telephone Encounter (Signed)
OV notes from today's visit mailed to patient per patient request Pt needed documentation of today's visit for [facility] purposes.  Nothing further needed.

## 2014-08-12 NOTE — Progress Notes (Signed)
Chief Complaint  Patient presents with  . SLEEP CONSULT    Referred by Dr Doristine Counter. Epworth Score: 10    History of Present Illness: Keith Norris is a 48 y.o. male for evaluation of sleep problems.  He was in [employer] from November 10, 1986 to November 25, 2010.  He was a Chief Strategy Officer, and then [employer].  He was stationed in [place] the [place], and [place].  He was also stationed in [address] and [place].  His wife has been concerned about his snoring for years.  This first started in 1999.  His wife has told him that he stops breathing while asleep also, and this has also been an issue since 1999.  This has been getting progressively worse.  He had sleep study through the [place] which showed mild obstructive sleep apnea with significant positional and REM effect.  He goes to sleep at 9 pm.  He falls asleep quickly.  He wakes up occasionally times to use the bathroom.  He gets out of bed at 5 am.  He feels tired sometimes in the morning.  He denies morning headache.  He does not use anything to help him fall sleep or stay awake.  He falls asleep easily if he is working on [employer] or watching TV.  He denies sleep walking, sleep talking, bruxism, or nightmares.  There is no history of restless legs.  He denies sleep hallucinations, sleep paralysis, or cataplexy.  The Epworth score is 10 out of 24.  Tests: PSG 11/04/11 >> AHI 10.4, SaO2 low 74%  Shon Indelicato  has a past medical history of Hypertension and Hyperlipidemia.  Kisean Rollo  has past surgical history that includes Spermatocelectomy and ORIF finger fracture (02/01/2012).  Prior to Admission medications   Medication Sig Start Date End Date Taking? Authorizing Provider  aspirin 81 MG tablet Take 81 mg by mouth daily.   Yes Historical Provider, MD  lisinopril (PRINIVIL,ZESTRIL) 5 MG tablet Take 5 mg by mouth daily.   Yes Historical Provider, MD  simvastatin (ZOCOR) 20 MG tablet Take 20 mg by mouth every  evening.   Yes Historical Provider, MD    No Known Allergies  His family history includes Heart disease in his other.  He  reports that he has never smoked. His smokeless tobacco use includes Chew. He reports that he does not drink alcohol or use illicit drugs.  Review of Systems  Constitutional: Negative for fever and unexpected weight change.  HENT: Negative for congestion, dental problem, ear pain, nosebleeds, postnasal drip, rhinorrhea, sinus pressure, sneezing, sore throat and trouble swallowing.   Eyes: Negative for redness and itching.  Respiratory: Negative for cough, chest tightness, shortness of breath and wheezing.   Cardiovascular: Negative for palpitations and leg swelling.  Gastrointestinal: Negative for nausea and vomiting.  Genitourinary: Negative for dysuria.  Musculoskeletal: Negative for joint swelling.  Skin: Negative for rash.  Neurological: Negative for headaches.  Hematological: Does not bruise/bleed easily.  Psychiatric/Behavioral: Negative for dysphoric mood. The patient is not nervous/anxious.    Physical Exam:  General - No distress ENT - No sinus tenderness, no oral exudate, no LAN, no thyromegaly, TM clear, pupils equal/reactive, MP 4, retrognathic, low laying soft palate Cardiac - s1s2 regular, no murmur, pulses symmetric Chest - No wheeze/rales/dullness, good air entry, normal respiratory excursion Back - No focal tenderness Abd - Soft, non-tender, no organomegaly, + bowel sounds Ext - No edema Neuro - Normal strength, cranial nerves intact Skin -  No rashes Psych - Normal mood, and behavior  Assessment/plan:  Coralyn Helling, M.D. Pager [phone]

## 2014-08-12 NOTE — Telephone Encounter (Signed)
Spoke with the pt  He states that he received call from our office asking him to call us back today after he got home from his appt  He does not know who called or why  Ash, did you or VS call him? Pls advise, thanks!

## 2019-07-24 ENCOUNTER — Encounter (HOSPITAL_COMMUNITY): Payer: Self-pay | Admitting: Emergency Medicine

## 2019-07-24 ENCOUNTER — Emergency Department (HOSPITAL_COMMUNITY)

## 2019-07-24 ENCOUNTER — Other Ambulatory Visit: Payer: Self-pay

## 2019-07-24 ENCOUNTER — Emergency Department (HOSPITAL_COMMUNITY)
Admission: EM | Admit: 2019-07-24 | Discharge: 2019-07-24 | Disposition: A | Discharge status: Home / Self Care | Attending: Emergency Medicine | Admitting: Emergency Medicine

## 2019-07-24 DIAGNOSIS — Z79899 Other long term (current) drug therapy: Secondary | ICD-10-CM | POA: Diagnosis not present

## 2019-07-24 DIAGNOSIS — Z7982 Long term (current) use of aspirin: Secondary | ICD-10-CM | POA: Insufficient documentation

## 2019-07-24 DIAGNOSIS — K5792 Diverticulitis of intestine, part unspecified, without perforation or abscess without bleeding: Secondary | ICD-10-CM | POA: Diagnosis not present

## 2019-07-24 DIAGNOSIS — R1084 Generalized abdominal pain: Secondary | ICD-10-CM

## 2019-07-24 DIAGNOSIS — I10 Essential (primary) hypertension: Secondary | ICD-10-CM | POA: Diagnosis not present

## 2019-07-24 DIAGNOSIS — R1011 Right upper quadrant pain: Secondary | ICD-10-CM | POA: Diagnosis present

## 2019-07-24 DIAGNOSIS — F17220 Nicotine dependence, chewing tobacco, uncomplicated: Secondary | ICD-10-CM | POA: Diagnosis not present

## 2019-07-24 LAB — CBC WITH DIFFERENTIAL/PLATELET
Abs Immature Granulocytes: 0.04 10*3/uL (ref 0.00–0.07)
Basophils Absolute: 0.1 10*3/uL (ref 0.0–0.1)
Basophils Relative: 1 %
Eosinophils Absolute: 0.3 10*3/uL (ref 0.0–0.5)
Eosinophils Relative: 2 %
HCT: 46.1 % (ref 39.0–52.0)
Hemoglobin: 15.5 g/dL (ref 13.0–17.0)
Immature Granulocytes: 0 %
Lymphocytes Relative: 11 %
Lymphs Abs: 1.5 10*3/uL (ref 0.7–4.0)
MCH: 29.8 pg (ref 26.0–34.0)
MCHC: 33.6 g/dL (ref 30.0–36.0)
MCV: 88.5 fL (ref 80.0–100.0)
Monocytes Absolute: 1.3 10*3/uL — ABNORMAL HIGH (ref 0.1–1.0)
Monocytes Relative: 10 %
Neutro Abs: 10.3 10*3/uL — ABNORMAL HIGH (ref 1.7–7.7)
Neutrophils Relative %: 76 %
Platelets: 270 10*3/uL (ref 150–400)
RBC: 5.21 MIL/uL (ref 4.22–5.81)
RDW: 12.9 % (ref 11.5–15.5)
WBC: 13.6 10*3/uL — ABNORMAL HIGH (ref 4.0–10.5)
nRBC: 0 % (ref 0.0–0.2)

## 2019-07-24 LAB — COMPREHENSIVE METABOLIC PANEL
ALT: 37 U/L (ref 0–44)
AST: 28 U/L (ref 15–41)
Albumin: 3.7 g/dL (ref 3.5–5.0)
Alkaline Phosphatase: 62 U/L (ref 38–126)
Anion gap: 11 (ref 5–15)
BUN: 18 mg/dL (ref 6–20)
CO2: 26 mmol/L (ref 22–32)
Calcium: 9.5 mg/dL (ref 8.9–10.3)
Chloride: 100 mmol/L (ref 98–111)
Creatinine, Ser: 1.13 mg/dL (ref 0.61–1.24)
GFR calc Af Amer: >60 mL/min (ref >60)
GFR calc non Af Amer: >60 mL/min (ref >60)
Glucose, Bld: 141 mg/dL — ABNORMAL HIGH (ref 70–99)
Potassium: 4 mmol/L (ref 3.5–5.1)
Sodium: 137 mmol/L (ref 135–145)
Total Bilirubin: 0.9 mg/dL (ref 0.3–1.2)
Total Protein: 7.1 g/dL (ref 6.5–8.1)

## 2019-07-24 LAB — LIPASE, BLOOD: Lipase: 25 U/L (ref 11–51)

## 2019-07-24 MED ORDER — AMOXICILLIN-POT CLAVULANATE 875-125 MG PO TABS: 1.0000 | ORAL_TABLET | Freq: Two times a day (BID) | ORAL | 0 refills | Status: AC

## 2019-07-24 MED ORDER — ONDANSETRON 4 MG PO TBDP: 4.0000 mg | ORAL_TABLET | Freq: Three times a day (TID) | ORAL | 0 refills | Status: AC | PRN

## 2019-07-24 MED ORDER — IOHEXOL 350 MG/ML SOLN
100.0000 mL | Freq: Once | INTRAVENOUS | Status: AC | PRN
  Administered 2019-07-24: 100 mL via INTRAVENOUS

## 2019-07-24 MED ORDER — AMOXICILLIN-POT CLAVULANATE 875-125 MG PO TABS
1.0000 | ORAL_TABLET | Freq: Once | ORAL | Status: AC
  Administered 2019-07-24: 1 via ORAL
  Filled 2019-07-24: qty 1

## 2019-07-24 MED ORDER — SODIUM CHLORIDE 0.9 % IV SOLN: INTRAVENOUS | Status: DC

## 2019-07-24 NOTE — ED Provider Notes (Signed)
Blood pressure (!) 151/97, pulse (!) 105, temperature 98.4 F (36.9 C), temperature source Oral, resp. rate 16, height 5\' 11"  (1.803 m), weight 108.9 kg, SpO2 92 %.  Assuming care from Dr. Zenia Resides.  In short, Keith Norris is a 53 y.o. male with a chief complaint of Abdominal Pain .  Refer to the original H&P for additional details.  04:50 PM  CT scan of the chest, abdomen, pelvis resulted with evidence of uncomplicated diverticulitis.  Gallbladder is unremarkable.  No PE.  Patient feeling well and tachycardia improved.  He does not want pain medication for home.  Discussed outpatient treatment with antibiotic and close follow-up with his primary care physician as well as gastroenterology to discuss follow-up colonoscopy once symptoms have resolved.  Discussed ED return precautions in detail.  Patient comfortable with the plan at discharge.    Margette Fast, MD 07/24/19 [phone]

## 2019-07-24 NOTE — Discharge Instructions (Signed)
You were seen in the emergency department today with abdominal pain.  We found evidence of diverticulitis.  This will require antibiotics and close follow-up with your primary care physician.  You should also schedule an appointment with a gastroenterologist as they often will perform a colonoscopy after your symptoms have resolved.  Take the antibiotics as prescribed, even if your symptoms completely resolved.  You may take Tylenol and/or Motrin for pain following the instructions on the packaging.  Take the Zofran as needed for nausea.  You should return to the emergency department with any sudden worsening abdominal pain, vomiting, fevers, or other severe symptoms.

## 2019-07-24 NOTE — ED Notes (Signed)
Patient Alert and oriented to baseline. Stable and ambulatory to baseline. Patient verbalized understanding of the discharge instructions.  Patient belongings were taken by the patient.   

## 2019-07-24 NOTE — ED Triage Notes (Signed)
Sudden onset of RLQ pain this morning. C/O nausea, denies vomiting and diarrhea

## 2019-07-24 NOTE — ED Provider Notes (Addendum)
[facility]   CSN: [id number] Arrival date & time: 07/24/19  1355     History   Chief Complaint Chief Complaint  Patient presents with  . Abdominal Pain    HPI Keith Norris is a 53 y.o. male.     53 year old male presents with sudden onset of right upper quadrant abdominal pain that began acutely today at work.  He said nausea but no vomiting.  States it hurts to take a deep breath.  Denies any new dyspnea.  No leg pain or swelling.  Pain is worse with movement better remaining still.  Denies any dysuria or hematuria.  No prior history of same and no treatment use prior to arrival     Past Medical History:  Diagnosis Date  . Hyperlipidemia   . Hypertension     Patient Active Problem List   Diagnosis Date Noted  . OSA (obstructive sleep apnea) 08/12/2014    Past Surgical History:  Procedure Laterality Date  . ORIF FINGER FRACTURE  02/01/2012   Procedure: OPEN REDUCTION INTERNAL FIXATION (ORIF) METACARPAL (FINGER) FRACTURE;  Surgeon: Sharma CovertFred W Ortmann, MD;  Location: [facility] OR;  Service: Orthopedics;  Laterality: Left;  lt small finger orif  . SPERMATOCELECTOMY          Home Medications    Prior to Admission medications   Medication Sig Start Date End Date Taking? Authorizing Provider  aspirin 81 MG tablet Take 81 mg by mouth daily.    [provider]  lisinopril (PRINIVIL,ZESTRIL) 5 MG tablet Take 5 mg by mouth daily.    [provider]  simvastatin (ZOCOR) 20 MG tablet Take 20 mg by mouth every evening.    [provider]    Family History Family History  Problem Relation Age of Onset  . Heart disease Other     Social History Social History   Tobacco Use  . Smoking status: Never Smoker  . Smokeless tobacco: Current User    Types: Chew  Substance Use Topics  . Alcohol use: No  . Drug use: No     Allergies   Patient has no known allergies.   Review of Systems Review  of Systems  All other systems reviewed and are negative.    Physical Exam Updated Vital Signs BP (!) 171/132 (BP Location: Left Arm)   Pulse (!) 124   Temp 98.4 F (36.9 C) (Oral)   Resp 16   Ht 1.803 m (5\' 11" )   Wt 108.9 kg   SpO2 96%   BMI 33.47 kg/m   Physical Exam Vitals signs and nursing note reviewed.  Constitutional:      General: He is not in acute distress.    Appearance: Normal appearance. He is well-developed. He is not toxic-appearing.  HENT:     Head: Normocephalic and atraumatic.  Eyes:     General: Lids are normal.     Conjunctiva/sclera: Conjunctivae normal.     Pupils: Pupils are equal, round, and reactive to light.  Neck:     Musculoskeletal: Normal range of motion and neck supple.     Thyroid: No thyroid mass.     Trachea: No tracheal deviation.  Cardiovascular:     Rate and Rhythm: Regular rhythm. Bradycardia present.     Heart sounds: Normal heart sounds. No murmur. No gallop.   Pulmonary:     Effort: Pulmonary effort is normal. No respiratory distress.     Breath sounds: Normal breath sounds. No stridor.  No decreased breath sounds, wheezing, rhonchi or rales.  Abdominal:     General: Bowel sounds are normal. There is no distension.     Palpations: Abdomen is soft.     Tenderness: There is abdominal tenderness in the right upper quadrant and epigastric area. There is guarding. There is no rebound.    Musculoskeletal: Normal range of motion.        General: No tenderness.  Skin:    General: Skin is warm and dry.     Findings: No abrasion or rash.  Neurological:     Mental Status: He is alert and oriented to person, place, and time.     GCS: GCS eye subscore is 4. GCS verbal subscore is 5. GCS motor subscore is 6.     Cranial Nerves: No cranial nerve deficit.     Sensory: No sensory deficit.  Psychiatric:        Speech: Speech normal.        Behavior: Behavior normal.      ED Treatments / Results  Labs (all labs ordered are listed,  but only abnormal results are displayed) Labs Reviewed  CBC WITH DIFFERENTIAL/PLATELET  COMPREHENSIVE METABOLIC PANEL  LIPASE, BLOOD    EKG None  Radiology No results found.  Procedures Procedures (including critical care time)  Medications Ordered in ED Medications  0.9 %  sodium chloride infusion (has no administration in time range)     Initial Impression / Assessment and Plan / ED Course  I have reviewed the triage vital signs and the nursing notes.  Pertinent labs & imaging results that were available during my care of the patient were reviewed by me and considered in my medical decision making (see chart for details).       Mild leukocytosis noted on cbc GB US could not see GB Pt with ruq pain and pleuritic pain w/ tachycardia Ct abd/chest ordered Care turned over to dr. Laverta Baltimore  Final Clinical Impressions(s) / ED Diagnoses   Final diagnoses:  None    ED Discharge Orders    None       Lacretia Leigh, MD 07/24/19 1432    Lacretia Leigh, MD 07/24/19 [phone]

## 2020-02-13 IMAGING — CT CT ABDOMEN AND PELVIS WITH CONTRAST
2 of 5 series · 15 of 46 positions shown, 17 images · IV contrast (omnipaque)
Comparison: None.

CLINICAL DATA: 53-year-old male with acute shortness of breath and
RIGHT abdominal and pelvic pain.

EXAM:
CT ANGIOGRAPHY CHEST
CT ABDOMEN AND PELVIS WITH CONTRAST
TECHNIQUE: Multidetector CT imaging of the chest was performed using the
standard protocol during bolus administration of intravenous
contrast. Multiplanar CT image reconstructions and MIPs were
obtained to evaluate the vascular anatomy. Multidetector CT imaging
of the abdomen and pelvis was performed using the standard protocol
during bolus administration of intravenous contrast.
CONTRAST:  100mL OMNIPAQUE IOHEXOL 350 MG/ML SOLN

[Series 3: a/p w/ 5mm · axial · 0.96mm/px · z∈[+942,+1456]mm · 12 of 115 slices shown, 14 images]
[im 6/115  soft-tissue]
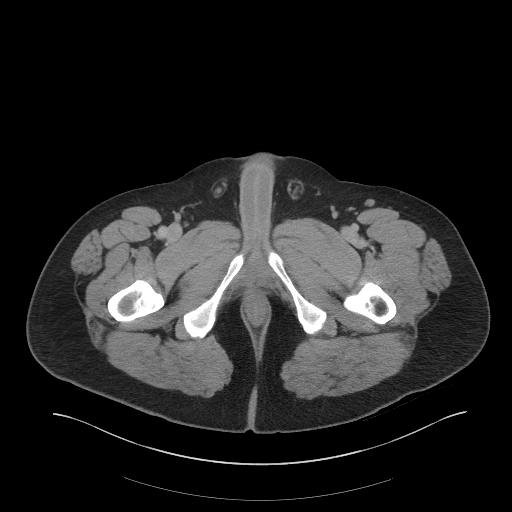
[im 6/115  bone]
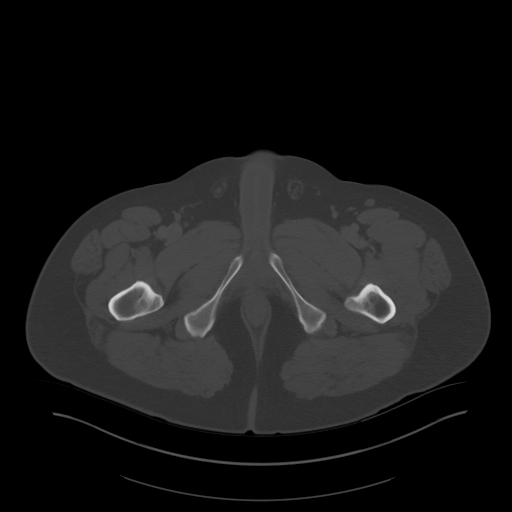
[im 18/115  soft-tissue]
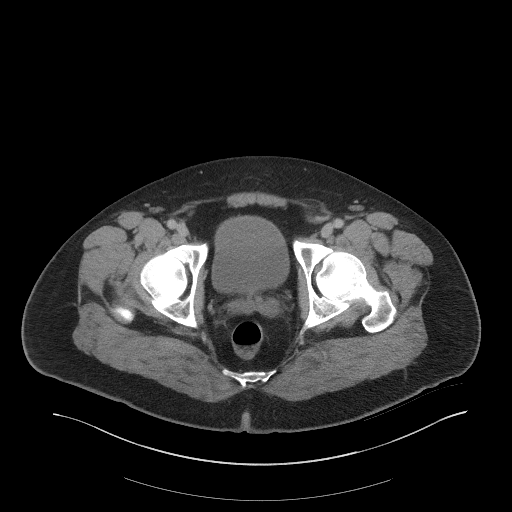
[im 23/115  soft-tissue]
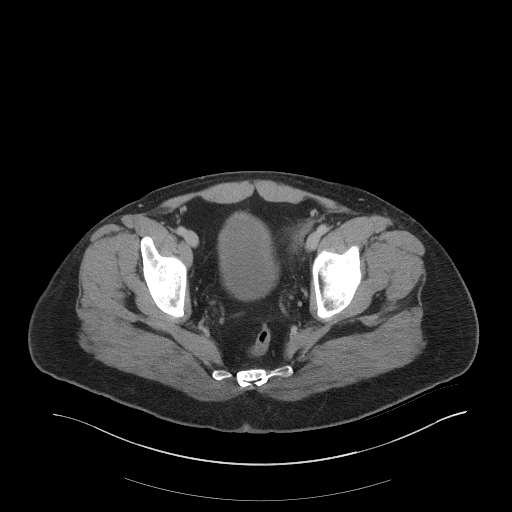
[im 35/115  soft-tissue]
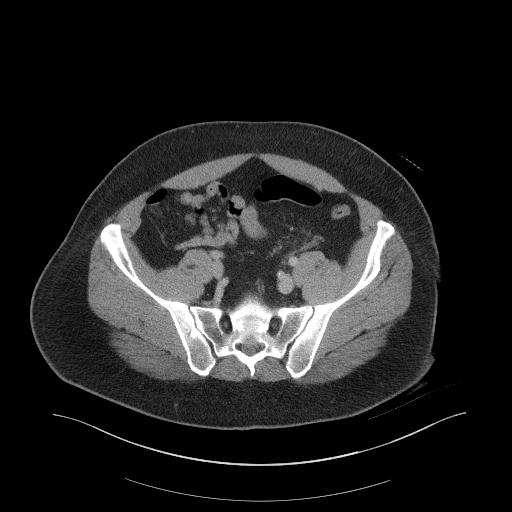
[im 46/115  soft-tissue]
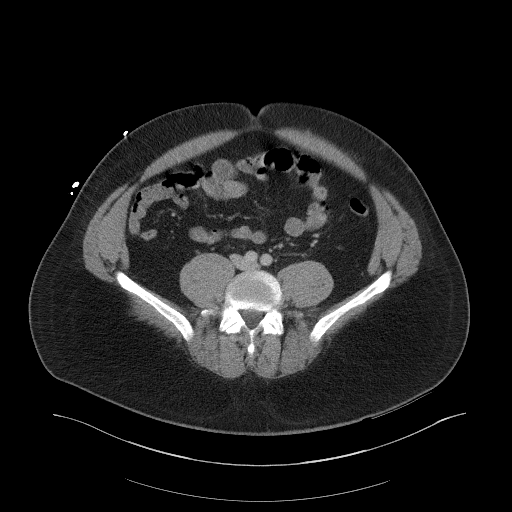
[im 52/115  soft-tissue]
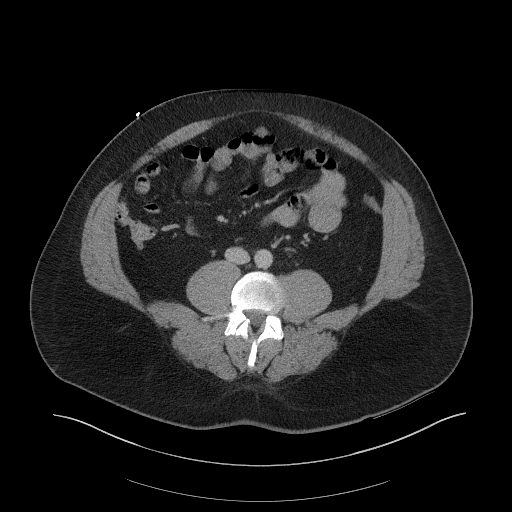
[im 63/115  soft-tissue]
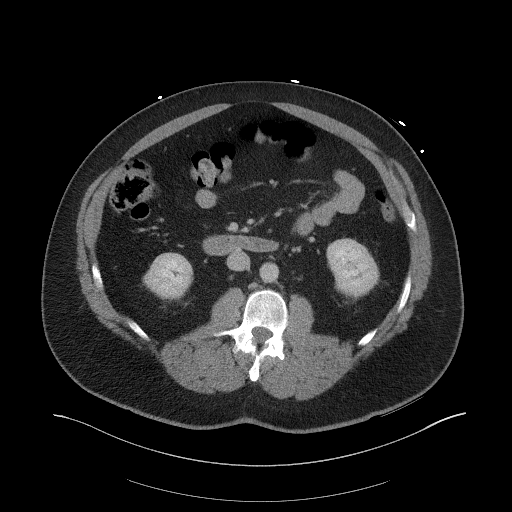
[im 69/115  soft-tissue]
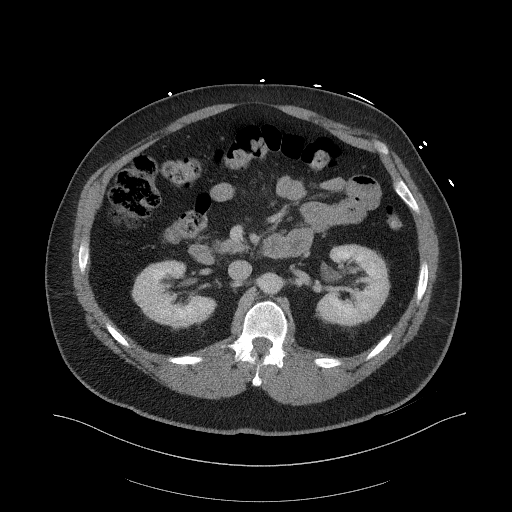
[im 80/115  soft-tissue]
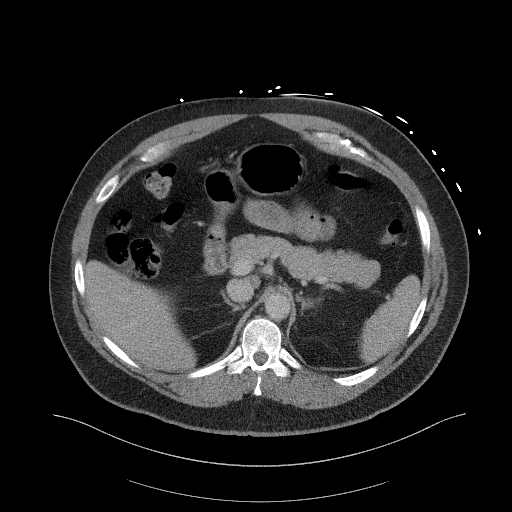
[im 80/115  bone]
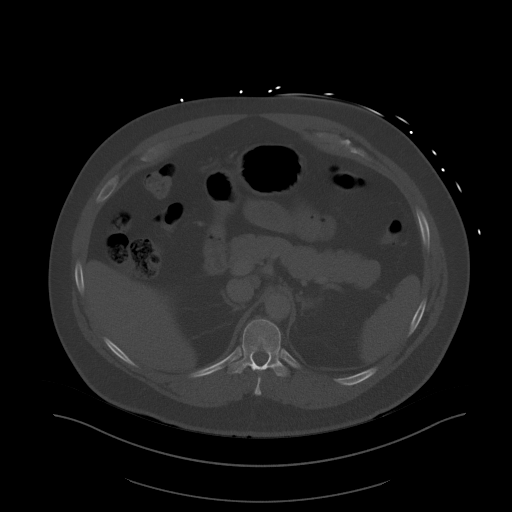
[im 92/115  soft-tissue]
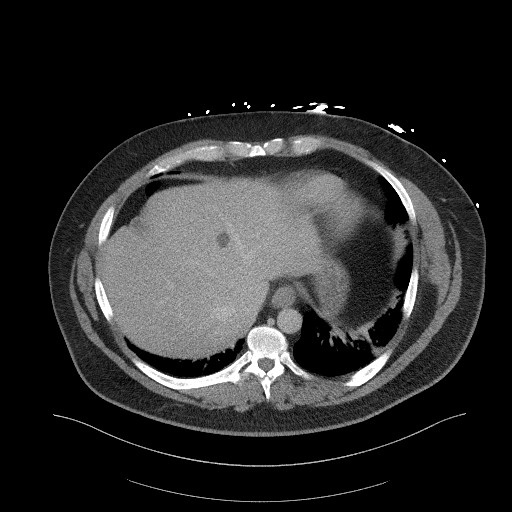
[im 97/115  soft-tissue]
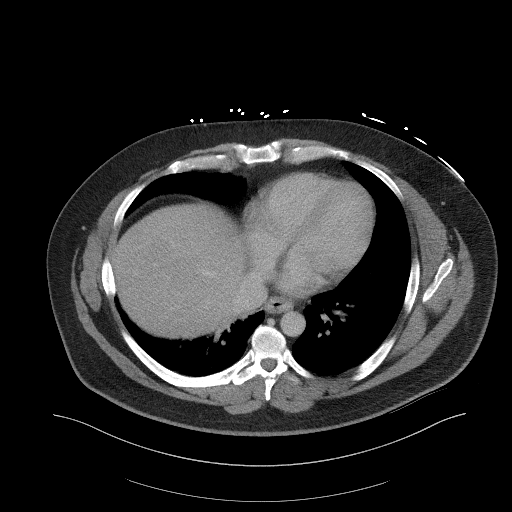
[im 109/115  soft-tissue]
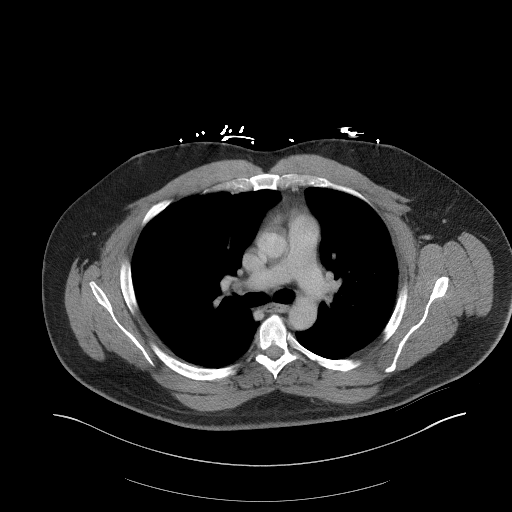

[Series 6: a/p w/ cor · coronal · 0.95mm/px · 3 of 175 slices shown]
[im 59/175  soft-tissue]
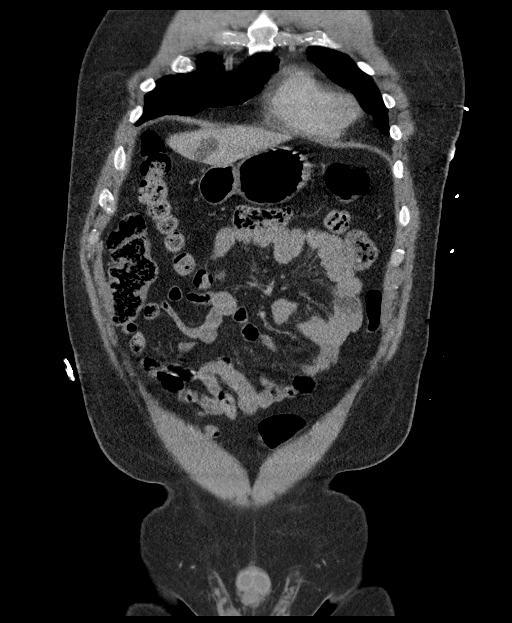
[im 78/175  soft-tissue]
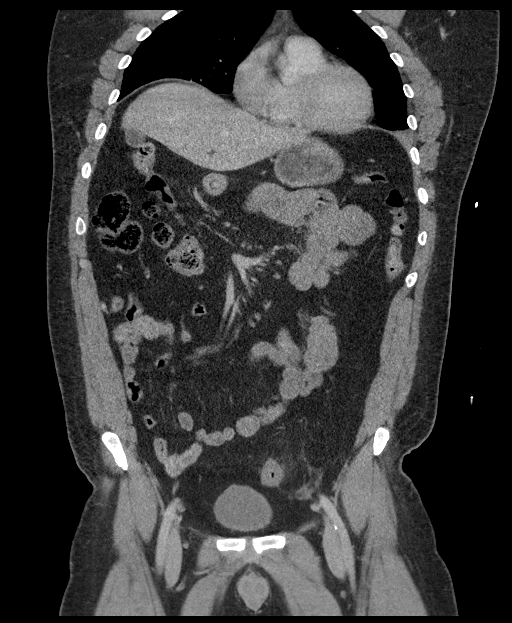
[im 97/175  soft-tissue]
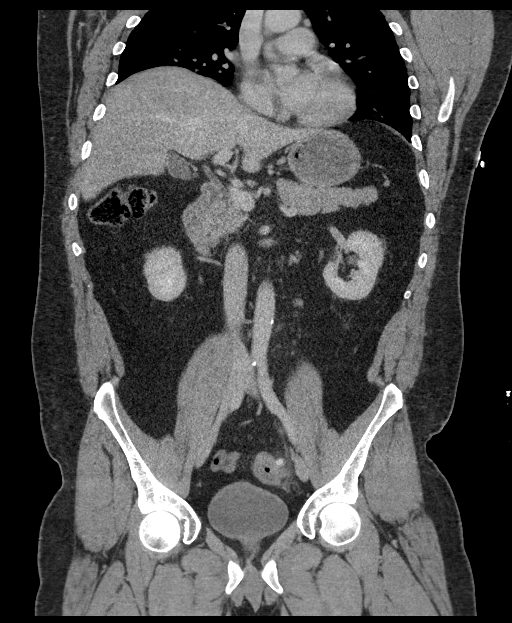

[15 of 46 positions shown; findings below may reference images not displayed]

FINDINGS: CTA CHEST FINDINGS

Cardiovascular: The study is borderline adequate for evaluation of
pulmonary emboli due to contrast opacification and respiratory
motion artifact.

No pulmonary emboli are identified. There is no evidence of thoracic
aortic aneurysm or definite dissection. Heart size normal. No
pericardial effusion.

Mediastinum/Nodes: No enlarged mediastinal, hilar, or axillary lymph
nodes. Thyroid gland, trachea, and esophagus demonstrate no
significant findings.

Lungs/Pleura: Dependent and bibasilar atelectasis identified. No
evidence of airspace disease, definite nodule, mass, pleural
effusion or pneumothorax.

Musculoskeletal: No acute or suspicious bony abnormalities.

Review of the MIP images confirms the above findings.

CT ABDOMEN and PELVIS FINDINGS

Hepatobiliary: The liver and gallbladder are unremarkable except for
hepatic cysts. No biliary dilatation.

Pancreas: Unremarkable

Spleen: Unremarkable

Adrenals/Urinary Tract: The kidneys, adrenal glands and bladder are
unremarkable.

Stomach/Bowel: Mild wall thickening of the proximal sigmoid colon is
noted with adjacent inflammation compatible with acute
diverticulitis. There is no evidence of bowel obstruction,
pneumoperitoneum or abscess.

Scattered sigmoid colonic diverticula are noted. No other bowel wall
thickening identified.

The appendix is normal.

Vascular/Lymphatic: Aortic atherosclerosis. No enlarged abdominal or
pelvic lymph nodes.

Reproductive: Prostate is unremarkable.

Other: No ascites or abdominal wall hernia.

Musculoskeletal: No acute or suspicious bony abnormalities.

Review of the MIP images confirms the above findings.
IMPRESSION: 1. Mild wall thickening of the proximal sigmoid colon with adjacent
inflammation compatible with acute diverticulitis. No abscess,
pneumoperitoneum or bowel obstruction. Clinical follow-up
recommended.
2. Dependent and bibasilar atelectasis.
3. No definite evidence of pulmonary emboli or thoracic aortic
aneurysm/dissection.
4.  Aortic Atherosclerosis ([id number]).
